# Patient Record
Sex: Female | Born: 1980 | Race: White | Hispanic: No | Marital: Married | Smoking: Never smoker
Health system: Southern US, Community
[De-identification: ages and names within clinical notes are randomized; demographics above are authoritative.]

---

## 2014-02-15 LAB — LIPID PANEL
CHOLESTEROL: 225 mg/dL — AB (ref 0–200)
HDL: 31 mg/dL — AB (ref 35–70)
LDL CALC: 159 mg/dL
TRIGLYCERIDES: 173 mg/dL — AB (ref 40–160)

## 2014-02-15 LAB — HM PAP SMEAR: HM PAP: NORMAL

## 2014-02-15 LAB — BASIC METABOLIC PANEL
BUN: 13 mg/dL (ref 4–21)
Creatinine: 0.8 mg/dL (ref 0.5–1.1)

## 2014-02-15 LAB — TSH: TSH: 0.9 u[IU]/mL (ref 0.41–5.90)

## 2014-02-15 LAB — CBC AND DIFFERENTIAL: Hemoglobin: 14.1 g/dL (ref 12.0–16.0)

## 2015-06-08 ENCOUNTER — Other Ambulatory Visit: Payer: Self-pay | Admitting: Internal Medicine

## 2015-06-08 ENCOUNTER — Encounter: Payer: Self-pay | Admitting: Internal Medicine

## 2015-06-08 DIAGNOSIS — E785 Hyperlipidemia, unspecified: Secondary | ICD-10-CM | POA: Insufficient documentation

## 2015-06-08 DIAGNOSIS — B009 Herpesviral infection, unspecified: Secondary | ICD-10-CM | POA: Insufficient documentation

## 2015-06-08 DIAGNOSIS — J45909 Unspecified asthma, uncomplicated: Secondary | ICD-10-CM | POA: Insufficient documentation

## 2015-06-08 DIAGNOSIS — J4520 Mild intermittent asthma, uncomplicated: Secondary | ICD-10-CM | POA: Insufficient documentation

## 2015-06-08 DIAGNOSIS — G5600 Carpal tunnel syndrome, unspecified upper limb: Secondary | ICD-10-CM | POA: Insufficient documentation

## 2015-10-23 ENCOUNTER — Telehealth: Payer: Self-pay

## 2015-10-23 NOTE — Telephone Encounter (Signed)
She will need to be seen for a prescription medication.  She has not been seen since 01/2014.

## 2015-10-23 NOTE — Telephone Encounter (Signed)
Patient wants diflucan called in call [phone].

## 2015-10-24 NOTE — Telephone Encounter (Signed)
[facility] x [facility]

## 2015-12-11 ENCOUNTER — Encounter: Payer: Self-pay | Admitting: Family Medicine

## 2016-01-22 DIAGNOSIS — M542 Cervicalgia: Secondary | ICD-10-CM | POA: Insufficient documentation

## 2016-02-18 ENCOUNTER — Encounter: Payer: Self-pay | Admitting: Internal Medicine

## 2016-02-18 ENCOUNTER — Ambulatory Visit (INDEPENDENT_AMBULATORY_CARE_PROVIDER_SITE_OTHER): Payer: 59 | Admitting: Internal Medicine

## 2016-02-18 VITALS — BP 118/80 | HR 84 | Resp 16 | Ht 61.0 in | Wt 190.0 lb

## 2016-02-18 DIAGNOSIS — L03012 Cellulitis of left finger: Secondary | ICD-10-CM | POA: Diagnosis not present

## 2016-02-18 DIAGNOSIS — J4520 Mild intermittent asthma, uncomplicated: Secondary | ICD-10-CM | POA: Diagnosis not present

## 2016-02-18 DIAGNOSIS — Z0000 Encounter for general adult medical examination without abnormal findings: Secondary | ICD-10-CM | POA: Diagnosis not present

## 2016-02-18 DIAGNOSIS — E785 Hyperlipidemia, unspecified: Secondary | ICD-10-CM | POA: Diagnosis not present

## 2016-02-18 DIAGNOSIS — B009 Herpesviral infection, unspecified: Secondary | ICD-10-CM

## 2016-02-18 DIAGNOSIS — Z1239 Encounter for other screening for malignant neoplasm of breast: Secondary | ICD-10-CM | POA: Diagnosis not present

## 2016-02-18 LAB — POCT URINALYSIS DIPSTICK
BILIRUBIN UA: NEGATIVE
GLUCOSE UA: NEGATIVE
Ketones, UA: NEGATIVE
Leukocytes, UA: NEGATIVE
Nitrite, UA: NEGATIVE
PH UA: 6
Protein, UA: NEGATIVE
RBC UA: NEGATIVE
SPEC GRAV UA: 1.01

## 2016-02-18 MED ORDER — ALBUTEROL SULFATE HFA 108 (90 BASE) MCG/ACT IN AERS
2.0000 | INHALATION_SPRAY | Freq: Four times a day (QID) | RESPIRATORY_TRACT | 5 refills | Status: DC | PRN
Start: 2016-02-18 — End: 2020-01-03

## 2016-02-18 MED ORDER — FLUCONAZOLE 100 MG PO TABS: 100.0000 mg | ORAL_TABLET | Freq: Every day | ORAL | 0 refills | Status: DC

## 2016-02-18 MED ORDER — VALACYCLOVIR HCL 1 G PO TABS: 1000.0000 mg | ORAL_TABLET | Freq: Every day | ORAL | 5 refills | Status: DC

## 2016-02-18 MED ORDER — AMOXICILLIN-POT CLAVULANATE 875-125 MG PO TABS: 1.0000 | ORAL_TABLET | Freq: Two times a day (BID) | ORAL | 0 refills | Status: DC

## 2016-02-18 NOTE — Progress Notes (Signed)
Date:  02/18/2016   Name:  Jennifer Perkins   DOB:  Jan 28, 1981   MRN:  [id number]   Chief Complaint: Annual Exam (Left hand 1st digit open sore hurting. ) Jennifer Perkins is a 35 y.o. female who presents today for her Complete Annual Exam. She feels well. Shereports exercising regularly. She reports she is sleeping well.   HPI Finger pain/swelling - started after working outside about one week ago.  No known injury but often gets scrapes and puncture wounds from shrubbery.  No exposure to rose thorns.  It developed small amount of purulent drainage which she expressed.  Now finger is more tender and swollen.  Review of Systems  Constitutional: Negative for chills, fatigue and fever.  HENT: Negative for congestion, hearing loss, tinnitus, trouble swallowing and voice change.   Eyes: Negative for visual disturbance.  Respiratory: Negative for cough, chest tightness, shortness of breath and wheezing.   Cardiovascular: Negative for chest pain, palpitations and leg swelling.  Gastrointestinal: Negative for abdominal pain, constipation, diarrhea and vomiting.  Endocrine: Negative for polydipsia and polyuria.  Genitourinary: Negative for dysuria, frequency, genital sores, menstrual problem, vaginal bleeding and vaginal discharge.  Musculoskeletal: Negative for arthralgias, gait problem and joint swelling.  Skin: Positive for color change and wound. Negative for rash.  Neurological: Negative for dizziness, tremors, light-headedness and headaches.  Hematological: Negative for adenopathy. Does not bruise/bleed easily.  Psychiatric/Behavioral: Negative for dysphoric mood and sleep disturbance. The patient is not nervous/anxious.     Patient Active Problem List   Diagnosis Date Noted  . 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 AB (asthmatic bronchitis) 06/08/2015  . HLD (hyperlipidemia) 06/08/2015  . Carpal tunnel syndrome 06/08/2015    Prior to Admission medications   Medication Sig  Start Date End Date Taking? Authorizing Provider  albuterol (VENTOLIN HFA) 108 (90 BASE) MCG/ACT inhaler Inhale 2 puffs into the lungs 4 (four) times daily as needed. 04/10/14  Yes Historical Provider, MD  meloxicam (MOBIC) 7.5 MG tablet Take 7.5 mg by mouth daily.   Yes Historical Provider, MD  methocarbamol (ROBAXIN) 500 MG tablet Take 500 mg by mouth 4 (four) times daily.   Yes Historical Provider, MD  valACYclovir (VALTREX) 1000 MG tablet Take 1 tablet by mouth daily. 11/09/14  Yes Historical Provider, MD  Norethindrone-Ethinyl Estradiol-Fe Biphas (LO LOESTRIN FE) 1 MG-10 MCG / 10 MCG tablet Take 1 tablet by mouth daily. 02/15/14   Historical Provider, MD    No Known Allergies  Past Surgical History:  Procedure Laterality Date  . CESAREAN SECTION  2007    Social History  Substance Use Topics  . Smoking status: Never Smoker  . Smokeless tobacco: Never Used  . Alcohol use 1.2 oz/week    2 Standard drinks or equivalent per week     Medication list has been reviewed and updated.   Physical Exam  Constitutional: She is oriented to person, place, and time. She appears well-developed and well-nourished. No distress.  HENT:  Head: Normocephalic and atraumatic.  Right Ear: Tympanic membrane and ear canal normal.  Left Ear: Tympanic membrane and ear canal normal.  Nose: Right sinus exhibits no maxillary sinus tenderness. Left sinus exhibits no maxillary sinus tenderness.  Mouth/Throat: Uvula is midline and oropharynx is clear and moist.  Eyes: Conjunctivae and EOM are normal. Right eye exhibits no discharge. Left eye exhibits no discharge. No scleral icterus.  Neck: Normal range of motion. Carotid bruit is not present. No erythema present. No thyromegaly present.  Cardiovascular: Normal rate,  regular rhythm, normal heart sounds and normal pulses.   Pulmonary/Chest: Effort normal. No respiratory distress. She has no wheezes. Right breast exhibits no mass, no nipple discharge, no skin  change and no tenderness. Left breast exhibits no mass, no nipple discharge, no skin change and no tenderness.  Abdominal: Soft. Bowel sounds are normal. There is no hepatosplenomegaly. There is no tenderness. There is no CVA tenderness.  Musculoskeletal: Normal range of motion.  Lymphadenopathy:    She has no cervical adenopathy.    She has no axillary adenopathy.  Neurological: She is alert and oriented to person, place, and time. She has normal reflexes. No cranial nerve deficit or sensory deficit.  Skin: Skin is warm, dry and intact. No rash noted.  Redness and swelling of proximal left index finger and MCP joint.  Puncture wound on distal aspect - peeling skin with no necrosis ~ 2 mm size  Psychiatric: She has a normal mood and affect. Her speech is normal and behavior is normal. Thought content normal.  Nursing note and vitals reviewed.   BP 118/80 (BP Location: Right Arm, Patient Position: Sitting, Cuff Size: Normal)   Pulse 84   Resp 16   Ht 5\' 1"  (1.549 m)   Wt 190 lb (86.2 kg)   LMP 02/01/2016   SpO2 97%   BMI 35.90 kg/m   Assessment and Plan: 1. Annual physical exam Labs and forms for employer to be done - Comprehensive metabolic panel - TSH - POCT urinalysis dipstick  2. Cellulitis of finger of left hand Need antibiotics - CBC with Differential/Platelet  3. Breast cancer screening Baseline needed - MM DIGITAL SCREENING BILATERAL; Future  4. AB (asthmatic bronchitis), mild intermittent, uncomplicated Albuterol PRN - albuterol (VENTOLIN HFA) 108 (90 Base) MCG/ACT inhaler; Inhale 2 puffs into the lungs 4 (four) times daily as needed.  Dispense: 18 g; Refill: 5  5. HLD (hyperlipidemia) - Lipid panel  6. Alphaherpesviral disease - valACYclovir (VALTREX) 1000 MG tablet; Take 1 tablet (1,000 mg total) by mouth daily.  Dispense: 30 tablet; Refill: 5   Bari EdwardLaura Shere Eisenhart, MD [facility]  02/18/2016

## 2016-02-18 NOTE — Patient Instructions (Signed)
Breast Self-Awareness Practicing breast self-awareness may pick up problems early, prevent significant medical complications, and possibly save your life. By practicing breast self-awareness, you can become familiar with how your breasts look and feel and if your breasts are changing. This allows you to notice changes early. It can also offer you some reassurance that your breast health is good. One way to learn what is normal for your breasts and whether your breasts are changing is to do a breast self-exam. If you find a lump or something that was not present in the past, it is best to contact your caregiver right away. Other findings that should be evaluated by your caregiver include nipple discharge, especially if it is bloody; skin changes or reddening; areas where the skin seems to be pulled in (retracted); or new lumps and bumps. Breast pain is seldom associated with cancer (malignancy), but should also be evaluated by a caregiver. HOW TO PERFORM A BREAST SELF-EXAM The best time to examine your breasts is 5-7 days after your menstrual period is over. During menstruation, the breasts are lumpier, and it may be more difficult to pick up changes. If you do not menstruate, have reached menopause, or had your uterus removed (hysterectomy), you should examine your breasts at regular intervals, such as monthly. If you are breastfeeding, examine your breasts after a feeding or after using a breast pump. Breast implants do not decrease the risk for lumps or tumors, so continue to perform breast self-exams as recommended. Talk to your caregiver about how to determine the difference between the implant and breast tissue. Also, talk about the amount of pressure you should use during the exam. Over time, you will become more familiar with the variations of your breasts and more comfortable with the exam. A breast self-exam requires you to remove all your clothes above the waist. 1. Look at your breasts and nipples.  Stand in front of a mirror in a room with good lighting. With your hands on your hips, push your hands firmly downward. Look for a difference in shape, contour, and size from one breast to the other (asymmetry). Asymmetry includes puckers, dips, or bumps. Also, look for skin changes, such as reddened or scaly areas on the breasts. Look for nipple changes, such as discharge, dimpling, repositioning, or redness. 2. Carefully feel your breasts. This is best done either in the shower or tub while using soapy water or when flat on your back. Place the arm (on the side of the breast you are examining) above your head. Use the pads (not the fingertips) of your three middle fingers on your opposite hand to feel your breasts. Start in the underarm area and use  inch (2 cm) overlapping circles to feel your breast. Use 3 different levels of pressure (light, medium, and firm pressure) at each circle before moving to the next circle. The light pressure is needed to feel the tissue closest to the skin. The medium pressure will help to feel breast tissue a little deeper, while the firm pressure is needed to feel the tissue close to the ribs. Continue the overlapping circles, moving downward over the breast until you feel your ribs below your breast. Then, move one finger-width towards the center of the body. Continue to use the  inch (2 cm) overlapping circles to feel your breast as you move slowly up toward the collar bone (clavicle) near the base of the neck. Continue the up and down exam using all 3 pressures until you reach the   middle of the chest. Do this with each breast, carefully feeling for lumps or changes. 3.  Keep a written record with breast changes or normal findings for each breast. By writing this information down, you do not need to depend only on memory for size, tenderness, or location. Write down where you are in your menstrual cycle, if you are still menstruating. Breast tissue can have some lumps or  thick tissue. However, see your caregiver if you find anything that concerns you.  SEEK MEDICAL CARE IF:  You see a change in shape, contour, or size of your breasts or nipples.   You see skin changes, such as reddened or scaly areas on the breasts or nipples.   You have an unusual discharge from your nipples.   You feel a new lump or unusually thick areas.    This information is not intended to replace advice given to you by your health care provider. Make sure you discuss any questions you have with your health care provider.   Document Released: 06/09/2005 Document Revised: 05/26/2012 Document Reviewed: 09/24/2011 Elsevier Interactive Patient Education 2016 Elsevier Inc.  

## 2016-02-19 LAB — COMPREHENSIVE METABOLIC PANEL
ALK PHOS: 55 IU/L (ref 39–117)
ALT: 18 IU/L (ref 0–32)
AST: 13 IU/L (ref 0–40)
Albumin/Globulin Ratio: 2 (ref 1.2–2.2)
Albumin: 4.5 g/dL (ref 3.5–5.5)
BUN/Creatinine Ratio: 18 (ref 9–23)
BUN: 12 mg/dL (ref 6–20)
Bilirubin Total: 0.4 mg/dL (ref 0.0–1.2)
CHLORIDE: 100 mmol/L (ref 96–106)
CO2: 23 mmol/L (ref 18–29)
Calcium: 9.4 mg/dL (ref 8.7–10.2)
Creatinine, Ser: 0.65 mg/dL (ref 0.57–1.00)
GFR calc Af Amer: 133 mL/min/{1.73_m2} (ref >59)
GFR calc non Af Amer: 115 mL/min/{1.73_m2} (ref >59)
GLUCOSE: 86 mg/dL (ref 65–99)
Globulin, Total: 2.3 g/dL (ref 1.5–4.5)
POTASSIUM: 4 mmol/L (ref 3.5–5.2)
SODIUM: 140 mmol/L (ref 134–144)
TOTAL PROTEIN: 6.8 g/dL (ref 6.0–8.5)

## 2016-02-19 LAB — CBC WITH DIFFERENTIAL/PLATELET
BASOS ABS: 0 10*3/uL (ref 0.0–0.2)
Basos: 0 %
EOS (ABSOLUTE): 0.3 10*3/uL (ref 0.0–0.4)
Eos: 5 %
Hematocrit: 40.5 % (ref 34.0–46.6)
Hemoglobin: 13.5 g/dL (ref 11.1–15.9)
IMMATURE GRANS (ABS): 0 10*3/uL (ref 0.0–0.1)
Immature Granulocytes: 0 %
Lymphocytes Absolute: 1.6 10*3/uL (ref 0.7–3.1)
Lymphs: 24 %
MCH: 29.7 pg (ref 26.6–33.0)
MCHC: 33.3 g/dL (ref 31.5–35.7)
MCV: 89 fL (ref 79–97)
Monocytes Absolute: 0.6 10*3/uL (ref 0.1–0.9)
Monocytes: 9 %
NEUTROS ABS: 4 10*3/uL (ref 1.4–7.0)
Neutrophils: 62 %
PLATELETS: 181 10*3/uL (ref 150–379)
RBC: 4.55 x10E6/uL (ref 3.77–5.28)
RDW: 13.1 % (ref 12.3–15.4)
WBC: 6.5 10*3/uL (ref 3.4–10.8)

## 2016-02-19 LAB — LIPID PANEL
CHOL/HDL RATIO: 5 ratio — AB (ref 0.0–4.4)
Cholesterol, Total: 203 mg/dL — ABNORMAL HIGH (ref 100–199)
HDL: 41 mg/dL (ref >39)
LDL Calculated: 142 mg/dL — ABNORMAL HIGH (ref 0–99)
TRIGLYCERIDES: 99 mg/dL (ref 0–149)
VLDL Cholesterol Cal: 20 mg/dL (ref 5–40)

## 2016-02-19 LAB — TSH: TSH: 1.45 u[IU]/mL (ref 0.450–4.500)

## 2016-03-06 ENCOUNTER — Other Ambulatory Visit: Payer: Self-pay | Admitting: Internal Medicine

## 2016-03-06 ENCOUNTER — Ambulatory Visit
Admission: RE | Admit: 2016-03-06 | Discharge: 2016-03-06 | Disposition: A | Discharge status: Home / Self Care | Payer: 59 | Source: Ambulatory Visit | Attending: Internal Medicine | Admitting: Internal Medicine

## 2016-03-06 DIAGNOSIS — Z1231 Encounter for screening mammogram for malignant neoplasm of breast: Secondary | ICD-10-CM | POA: Diagnosis not present

## 2016-03-06 DIAGNOSIS — Z1239 Encounter for other screening for malignant neoplasm of breast: Secondary | ICD-10-CM

## 2016-03-07 ENCOUNTER — Other Ambulatory Visit: Payer: Self-pay | Admitting: Internal Medicine

## 2016-03-07 DIAGNOSIS — N6320 Unspecified lump in the left breast, unspecified quadrant: Secondary | ICD-10-CM

## 2016-03-20 ENCOUNTER — Ambulatory Visit
Admission: RE | Admit: 2016-03-20 | Discharge: 2016-03-20 | Disposition: A | Discharge status: Home / Self Care | Payer: 59 | Source: Ambulatory Visit | Attending: Internal Medicine | Admitting: Internal Medicine

## 2016-03-20 DIAGNOSIS — N6320 Unspecified lump in the left breast, unspecified quadrant: Secondary | ICD-10-CM

## 2016-03-20 DIAGNOSIS — N63 Unspecified lump in breast: Secondary | ICD-10-CM | POA: Diagnosis not present

## 2016-04-01 ENCOUNTER — Encounter: Payer: Self-pay | Admitting: Internal Medicine

## 2016-05-22 ENCOUNTER — Other Ambulatory Visit: Payer: Self-pay | Admitting: Internal Medicine

## 2016-05-22 ENCOUNTER — Telehealth: Payer: Self-pay

## 2016-05-22 MED ORDER — ACYCLOVIR 400 MG PO TABS: 400.0000 mg | ORAL_TABLET | Freq: Two times a day (BID) | ORAL | 5 refills | Status: DC

## 2016-05-22 NOTE — Telephone Encounter (Signed)
Let patient know that I sent in Acyclovir 400 mg bid.

## 2016-05-22 NOTE — Telephone Encounter (Signed)
Valcyclovir to expensive but Control and instrumentation engineerAcycliver is cheaper per insurance. Patient wants us to call it in but not sure what dose would work best. She was cutting the Valcycliver in half. Wants a BID med ONLY and to use for outbreaks only.

## 2017-02-24 ENCOUNTER — Other Ambulatory Visit: Payer: Self-pay | Admitting: Internal Medicine

## 2017-02-24 ENCOUNTER — Ambulatory Visit (INDEPENDENT_AMBULATORY_CARE_PROVIDER_SITE_OTHER): Payer: 59 | Admitting: Internal Medicine

## 2017-02-24 ENCOUNTER — Encounter: Payer: Self-pay | Admitting: Internal Medicine

## 2017-02-24 VITALS — BP 128/80 | HR 83 | Ht 61.0 in | Wt 178.8 lb

## 2017-02-24 DIAGNOSIS — L659 Nonscarring hair loss, unspecified: Secondary | ICD-10-CM | POA: Diagnosis not present

## 2017-02-24 DIAGNOSIS — E782 Mixed hyperlipidemia: Secondary | ICD-10-CM | POA: Diagnosis not present

## 2017-02-24 DIAGNOSIS — B009 Herpesviral infection, unspecified: Secondary | ICD-10-CM | POA: Diagnosis not present

## 2017-02-24 DIAGNOSIS — Z0000 Encounter for general adult medical examination without abnormal findings: Secondary | ICD-10-CM | POA: Diagnosis not present

## 2017-02-24 DIAGNOSIS — J4520 Mild intermittent asthma, uncomplicated: Secondary | ICD-10-CM

## 2017-02-24 LAB — POCT URINALYSIS DIPSTICK
BILIRUBIN UA: NEGATIVE
GLUCOSE UA: NEGATIVE
Ketones, UA: NEGATIVE
Leukocytes, UA: NEGATIVE
NITRITE UA: NEGATIVE
Protein, UA: NEGATIVE
Spec Grav, UA: 1.025 (ref 1.010–1.025)
UROBILINOGEN UA: 0.2 U/dL
pH, UA: 5 (ref 5.0–8.0)

## 2017-02-24 MED ORDER — VALACYCLOVIR HCL 1 G PO TABS: 1000.0000 mg | ORAL_TABLET | Freq: Every day | ORAL | 5 refills | Status: DC

## 2017-02-24 NOTE — Patient Instructions (Signed)
Begin a Multi-vitamin with minerals and iron daily - and/or a 'skin hair and nails' supplement.  Breast Self-Awareness Breast self-awareness means being familiar with how your breasts look and feel. It involves checking your breasts regularly and reporting any changes to your health care provider. Practicing breast self-awareness is important. A change in your breasts can be a sign of a serious medical problem. Being familiar with how your breasts look and feel allows you to find any problems early, when treatment is more likely to be successful. All women should practice breast self-awareness, including women who have had breast implants. How to do a breast self-exam One way to learn what is normal for your breasts and whether your breasts are changing is to do a breast self-exam. To do a breast self-exam: Look for Changes  1. Remove all the clothing above your waist. 2. Stand in front of a mirror in a room with good lighting. 3. Put your hands on your hips. 4. Push your hands firmly downward. 5. Compare your breasts in the mirror. Look for differences between them (asymmetry), such as: ? Differences in shape. ? Differences in size. ? Puckers, dips, and bumps in one breast and not the other. 6. Look at each breast for changes in your skin, such as: ? Redness. ? Scaly areas. 7. Look for changes in your nipples, such as: ? Discharge. ? Bleeding. ? Dimpling. ? Redness. ? A change in position. Feel for Changes  Carefully feel your breasts for lumps and changes. It is best to do this while lying on your back on the floor and again while sitting or standing in the shower or tub with soapy water on your skin. Feel each breast in the following way:  Place the arm on the side of the breast you are examining above your head.  Feel your breast with the other hand.  Start in the nipple area and make  inch (2 cm) overlapping circles to feel your breast. Use the pads of your three middle  fingers to do this. Apply light pressure, then medium pressure, then firm pressure. The light pressure will allow you to feel the tissue closest to the skin. The medium pressure will allow you to feel the tissue that is a little deeper. The firm pressure will allow you to feel the tissue close to the ribs.  Continue the overlapping circles, moving downward over the breast until you feel your ribs below your breast.  Move one finger-width toward the center of the body. Continue to use the  inch (2 cm) overlapping circles to feel your breast as you move slowly up toward your collarbone.  Continue the up and down exam using all three pressures until you reach your armpit.  Write Down What You Find  Write down what is normal for each breast and any changes that you find. Keep a written record with breast changes or normal findings for each breast. By writing this information down, you do not need to depend only on memory for size, tenderness, or location. Write down where you are in your menstrual cycle, if you are still menstruating. If you are having trouble noticing differences in your breasts, do not get discouraged. With time you will become more familiar with the variations in your breasts and more comfortable with the exam. How often should I examine my breasts? Examine your breasts every month. If you are breastfeeding, the best time to examine your breasts is after a feeding or after using a  breast pump. If you menstruate, the best time to examine your breasts is 5-7 days after your period is over. During your period, your breasts are lumpier, and it may be more difficult to notice changes. When should I see my health care provider? See your health care provider if you notice:  A change in shape or size of your breasts or nipples.  A change in the skin of your breast or nipples, such as a reddened or scaly area.  Unusual discharge from your nipples.  A lump or thick area that was not  there before.  Pain in your breasts.  Anything that concerns you.  This information is not intended to replace advice given to you by your health care provider. Make sure you discuss any questions you have with your health care provider. Document Released: 06/09/2005 Document Revised: 11/15/2015 Document Reviewed: 04/29/2015 Elsevier Interactive Patient Education  [employer]2018 Elsevier Inc.

## 2017-02-24 NOTE — Progress Notes (Signed)
Date:  02/24/2017   Name:  Jennifer Perkins   DOB:  14-Jun-1981   MRN:  [id number]   Chief Complaint: Annual Exam (Breast and Pap. Noticed Acyclovir is not working as well as Valacyclovir. Wants to discuss. Wants to get thyroid checked- states hair is thinning and she feels some "crowdedness around neck." States she also has been having up and down mood swings.  ) Jennifer Perkins is a 36 y.o. female who presents today for her Complete Annual Exam. She feels well. She reports exercising 3-4 times per week. Also doing 200 sit ups daily.  She reports she is sleeping well.  She has been seeing a weight loss clinic and went on an 800 calorie diet.  Now doing intermittent fasting and low carb.  Asthma  There is no cough, shortness of breath or wheezing. This is a chronic problem. The problem occurs intermittently. Pertinent negatives include no chest pain, fever, headaches, sore throat or trouble swallowing. Her symptoms are alleviated by beta-agonist. Her past medical history is significant for asthma.  Hair thinning - the past few months has noticed more hair in the shower and her hair brush. No bowel patches. Neck fullness - she's noticed over the past several months and feels as if there is a fullness in her throat, especially when she sleeps on her stomach with her head to the side. She is concerned about possible thyroid enlargement or she feels no trouble swallowing or sore throat. Weight is stable. Mild constipation but no dry skin or rashes. 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 Having more outbreaks and less control. She would like to go back to Valtrex. Review of Systems  Constitutional: Negative for chills, fatigue and fever.  HENT: Negative for congestion, hearing loss, sore throat, tinnitus, trouble swallowing and voice change.   Eyes: Negative for visual disturbance.  Respiratory: Negative for cough, chest tightness, shortness of breath and  wheezing.   Cardiovascular: Negative for chest pain, palpitations and leg swelling.  Gastrointestinal: Negative for abdominal pain, constipation, diarrhea and vomiting.  Endocrine: Negative for polydipsia and polyuria.  Genitourinary: Negative for dysuria, frequency, genital sores, menstrual problem (menses regular, last 7 days, slightly heavier), vaginal bleeding and vaginal discharge.  Musculoskeletal: Positive for neck pain (fullness when sleeping on abdomen with head turned to the side). Negative for arthralgias, gait problem and joint swelling.  Skin: Positive for rash. Negative for color change and wound.  Allergic/Immunologic: Negative for environmental allergies.  Neurological: Negative for dizziness, tremors, light-headedness and headaches.  Hematological: Negative for adenopathy. Does not bruise/bleed easily.  Psychiatric/Behavioral: Negative for decreased concentration, dysphoric mood and sleep disturbance. The patient is not nervous/anxious.     Patient Active Problem List   Diagnosis Date Noted  . Neck pain 01/22/2016  . 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 AB (asthmatic bronchitis) 06/08/2015  . HLD (hyperlipidemia) 06/08/2015  . Carpal tunnel syndrome 06/08/2015    Prior to Admission medications   Medication Sig Start Date End Date Taking? Authorizing Provider  acyclovir (ZOVIRAX) 400 MG tablet Take 1 tablet (400 mg total) by mouth 2 (two) times daily. 05/22/16  Yes Reubin Milan, MD  albuterol (VENTOLIN HFA) 108 (90 Base) MCG/ACT inhaler Inhale 2 puffs into the lungs 4 (four) times daily as needed. 02/18/16  Yes Reubin Milan, MD    No Known Allergies  Past Surgical History:  Procedure Laterality Date  . CESAREAN SECTION  2007    Social History  Substance Use Topics  . Smoking status: Never Smoker  . Smokeless tobacco: Never Used  . Alcohol use 1.2 oz/week    2 Standard drinks or equivalent per week   Urine dipstick shows positive for RBC's.  Micro  exam: negative for WBC's or RBC's.   Medication list has been reviewed and updated.  PHQ 2/9 Scores 02/24/2017 02/18/2016  PHQ - 2 Score 1 0    Physical Exam  Constitutional: She is oriented to person, place, and time. She appears well-developed and well-nourished. No distress.  HENT:  Head: Normocephalic and atraumatic.  Right Ear: Tympanic membrane and ear canal normal.  Left Ear: Tympanic membrane and ear canal normal.  Nose: Right sinus exhibits no maxillary sinus tenderness. Left sinus exhibits no maxillary sinus tenderness.  Mouth/Throat: Uvula is midline and oropharynx is clear and moist.  Eyes: Conjunctivae and EOM are normal. Right eye exhibits no discharge. Left eye exhibits no discharge. No scleral icterus.  Neck: Normal range of motion. No muscular tenderness (but sternocleidomastoid muscles are enlarged) present. Carotid bruit is not present. No erythema present. No thyroid mass and no thyromegaly present.  Cardiovascular: Normal rate, regular rhythm, normal heart sounds and normal pulses.   Pulmonary/Chest: Effort normal. No respiratory distress. She has no wheezes. Right breast exhibits no mass, no nipple discharge, no skin change and no tenderness. Left breast exhibits no mass, no nipple discharge, no skin change and no tenderness.  Abdominal: Soft. Bowel sounds are normal. There is no hepatosplenomegaly. There is no tenderness. There is no CVA tenderness.  Genitourinary: Vagina normal and uterus normal. There is no tenderness, lesion or injury on the right labia. There is no tenderness, lesion or injury on the left labia. Cervix exhibits no motion tenderness. Right adnexum displays no mass, no tenderness and no fullness. Left adnexum displays no mass, no tenderness and no fullness.  Musculoskeletal: Normal range of motion.  Lymphadenopathy:    She has no cervical adenopathy.    She has no axillary adenopathy.  Neurological: She is alert and oriented to person, place, and  time. She has normal reflexes. No cranial nerve deficit or sensory deficit.  Skin: Skin is warm, dry and intact. No rash noted.  Scalp normal   Psychiatric: She has a normal mood and affect. Her speech is normal and behavior is normal. Thought content normal.  Nursing note and vitals reviewed.   BP 128/80   Pulse 83   Ht 5\' 1"  (1.549 m)   Wt 178 lb 12.8 oz (81.1 kg)   LMP 02/01/2017 (Exact Date)   SpO2 98%   BMI 33.78 kg/m   Assessment and Plan: 1. Annual physical exam    Continue exercise and dietary changes Consider multi-vit with minerals or Skin/hair/nails supplement                             - Comprehensive metabolic panel - POCT urinalysis dipstick - Pap IG and HPV (high risk) DNA detection - Hemoglobin A1c  2. Mixed hyperlipidemia - Lipid panel  3. Alphaherpesviral disease Change back to valtrex for better efficacy - valACYclovir (VALTREX) 1000 MG tablet; Take 1 tablet (1,000 mg total) by mouth daily.  Dispense: 30 tablet; Refill: 5 - CBC with Differential/Platelet  4. Hair thinning - TSH  5. Mild intermittent asthma in adult without complication Stable; using albuterol only several times per year   Meds ordered this encounter  Medications  . valACYclovir (VALTREX) 1000 MG  tablet    Sig: Take 1 tablet (1,000 mg total) by mouth daily.    Dispense:  30 tablet    Refill:  5    Partially dictated using Animal nutritionist. Any errors are unintentional.  Bari Edward, MD [facility]  02/24/2017

## 2017-02-25 LAB — LIPID PANEL
CHOL/HDL RATIO: 5.9 ratio — AB (ref 0.0–4.4)
Cholesterol, Total: 219 mg/dL — ABNORMAL HIGH (ref 100–199)
HDL: 37 mg/dL — ABNORMAL LOW (ref >39)
LDL CALC: 160 mg/dL — AB (ref 0–99)
Triglycerides: 108 mg/dL (ref 0–149)
VLDL Cholesterol Cal: 22 mg/dL (ref 5–40)

## 2017-02-25 LAB — COMPREHENSIVE METABOLIC PANEL
A/G RATIO: 1.7 (ref 1.2–2.2)
ALBUMIN: 4.5 g/dL (ref 3.5–5.5)
ALT: 16 IU/L (ref 0–32)
AST: 16 IU/L (ref 0–40)
Alkaline Phosphatase: 58 IU/L (ref 39–117)
BUN / CREAT RATIO: 18 (ref 9–23)
BUN: 14 mg/dL (ref 6–20)
Bilirubin Total: 0.8 mg/dL (ref 0.0–1.2)
CO2: 23 mmol/L (ref 20–29)
CREATININE: 0.79 mg/dL (ref 0.57–1.00)
Calcium: 9.4 mg/dL (ref 8.7–10.2)
Chloride: 101 mmol/L (ref 96–106)
GFR, EST AFRICAN AMERICAN: 111 mL/min/{1.73_m2} (ref >59)
GFR, EST NON AFRICAN AMERICAN: 97 mL/min/{1.73_m2} (ref >59)
GLOBULIN, TOTAL: 2.6 g/dL (ref 1.5–4.5)
Glucose: 95 mg/dL (ref 65–99)
POTASSIUM: 4 mmol/L (ref 3.5–5.2)
SODIUM: 139 mmol/L (ref 134–144)
TOTAL PROTEIN: 7.1 g/dL (ref 6.0–8.5)

## 2017-02-25 LAB — CBC WITH DIFFERENTIAL/PLATELET
BASOS: 0 %
Basophils Absolute: 0 10*3/uL (ref 0.0–0.2)
EOS (ABSOLUTE): 0.2 10*3/uL (ref 0.0–0.4)
EOS: 4 %
HEMATOCRIT: 41.7 % (ref 34.0–46.6)
HEMOGLOBIN: 13.5 g/dL (ref 11.1–15.9)
IMMATURE GRANS (ABS): 0 10*3/uL (ref 0.0–0.1)
IMMATURE GRANULOCYTES: 0 %
Lymphocytes Absolute: 1.2 10*3/uL (ref 0.7–3.1)
Lymphs: 21 %
MCH: 28.8 pg (ref 26.6–33.0)
MCHC: 32.4 g/dL (ref 31.5–35.7)
MCV: 89 fL (ref 79–97)
MONOCYTES: 9 %
MONOS ABS: 0.5 10*3/uL (ref 0.1–0.9)
NEUTROS PCT: 66 %
Neutrophils Absolute: 3.9 10*3/uL (ref 1.4–7.0)
Platelets: 200 10*3/uL (ref 150–379)
RBC: 4.69 x10E6/uL (ref 3.77–5.28)
RDW: 13.8 % (ref 12.3–15.4)
WBC: 5.9 10*3/uL (ref 3.4–10.8)

## 2017-02-25 LAB — HEMOGLOBIN A1C
ESTIMATED AVERAGE GLUCOSE: 100 mg/dL
Hgb A1c MFr Bld: 5.1 % (ref 4.8–5.6)

## 2017-02-25 LAB — TSH: TSH: 1.94 u[IU]/mL (ref 0.450–4.500)

## 2017-02-26 LAB — PAP IG AND HPV HIGH-RISK
HPV, high-risk: NEGATIVE
PAP Smear Comment: 0

## 2017-02-26 LAB — PLEASE NOTE

## 2018-02-27 ENCOUNTER — Other Ambulatory Visit: Payer: Self-pay | Admitting: Internal Medicine

## 2018-02-27 DIAGNOSIS — B009 Herpesviral infection, unspecified: Secondary | ICD-10-CM

## 2019-02-18 ENCOUNTER — Ambulatory Visit (INDEPENDENT_AMBULATORY_CARE_PROVIDER_SITE_OTHER): Payer: 59 | Admitting: Internal Medicine

## 2019-02-18 ENCOUNTER — Other Ambulatory Visit: Payer: Self-pay

## 2019-02-18 ENCOUNTER — Encounter: Payer: Self-pay | Admitting: Internal Medicine

## 2019-02-18 VITALS — BP 108/77 | HR 95 | Resp 16 | Ht 61.0 in | Wt 178.6 lb

## 2019-02-18 DIAGNOSIS — L03211 Cellulitis of face: Secondary | ICD-10-CM | POA: Diagnosis not present

## 2019-02-18 DIAGNOSIS — B009 Herpesviral infection, unspecified: Secondary | ICD-10-CM | POA: Diagnosis not present

## 2019-02-18 MED ORDER — SULFAMETHOXAZOLE-TRIMETHOPRIM 800-160 MG PO TABS: 1.0000 | ORAL_TABLET | Freq: Two times a day (BID) | ORAL | 0 refills | Status: AC

## 2019-02-18 MED ORDER — VALACYCLOVIR HCL 1 G PO TABS: 1000.0000 mg | ORAL_TABLET | Freq: Every day | ORAL | 5 refills | Status: DC

## 2019-02-18 NOTE — Progress Notes (Signed)
Date:  02/18/2019   Name:  Jennifer Perkins   DOB:  1980-12-01   MRN:  [id number]   Chief Complaint: Eye Problem (cold sore under eye since Sunday )  Rash This is a recurrent problem. The problem has been gradually improving since onset. The affected locations include the face. The rash is characterized by blistering, redness and swelling. Pertinent negatives include no cough, eye pain, fatigue, fever or shortness of breath. (Recurrent HSV1)  She noted the onset of the lesion 5 days ago.  She started taking Valtrex twice a day.  Yesterday after a nap the lesion was more swollen and she became concerned.  Today it is less swollen but she decided to come anyway.  Review of Systems  Constitutional: Negative for chills, fatigue, fever and unexpected weight change.  HENT: Positive for facial swelling. Negative for ear pain and trouble swallowing.   Eyes: Negative for pain, redness and visual disturbance.  Respiratory: Negative for cough, chest tightness and shortness of breath.   Cardiovascular: Negative for chest pain.  Skin: Positive for rash.  Neurological: Negative for dizziness, light-headedness and headaches.  Psychiatric/Behavioral: Negative for dysphoric mood. The patient is not nervous/anxious.     Patient Active Problem List   Diagnosis Date Noted  . Neck pain 01/22/2016  . 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 Asthma in adult 06/08/2015  . HLD (hyperlipidemia) 06/08/2015  . Carpal tunnel syndrome 06/08/2015    No Known Allergies  Past Surgical History:  Procedure Laterality Date  . CESAREAN SECTION  2007    Social History   Tobacco Use  . Smoking status: Never Smoker  . Smokeless tobacco: Never Used  Substance Use Topics  . Alcohol use: Yes    Alcohol/week: 2.0 standard drinks    Types: 2 Standard drinks or equivalent per week  . Drug use: Not on file     Medication list has been reviewed and updated.  Current Meds  Medication Sig  . albuterol  (VENTOLIN HFA) 108 (90 Base) MCG/ACT inhaler Inhale 2 puffs into the lungs 4 (four) times daily as needed.  . valACYclovir (VALTREX) 1000 MG tablet TAKE 1 TABLET BY MOUTH EVERY DAY    PHQ 2/9 Scores 02/18/2019 02/24/2017 02/18/2016  PHQ - 2 Score 0 1 0    BP Readings from Last 3 Encounters:  02/18/19 108/77  02/24/17 128/80  02/18/16 118/80    Physical Exam Vitals signs and nursing note reviewed.  Constitutional:      General: She is not in acute distress.    Appearance: She is well-developed.  HENT:     Head: Normocephalic and atraumatic.  Eyes:     Extraocular Movements: Extraocular movements intact.     Conjunctiva/sclera: Conjunctivae normal.     Pupils: Pupils are equal, round, and reactive to light.  Cardiovascular:     Rate and Rhythm: Normal rate and regular rhythm.     Pulses: Normal pulses.     Heart sounds: No murmur.  Pulmonary:     Effort: Pulmonary effort is normal. No respiratory distress.     Breath sounds: No wheezing or rhonchi.  Musculoskeletal: Normal range of motion.  Lymphadenopathy:     Cervical: No cervical adenopathy.  Skin:    General: Skin is warm and dry.     Findings: No rash.       Neurological:     Mental Status: She is alert and oriented to person, place, and time.  Psychiatric:  Behavior: Behavior normal.        Thought Content: Thought content normal.     Wt Readings from Last 3 Encounters:  02/18/19 178 lb 9.6 oz (81 kg)  02/24/17 178 lb 12.8 oz (81.1 kg)  02/18/16 190 lb (86.2 kg)    BP 108/77   Pulse 95   Resp 16   Ht 5\' 1"  (1.549 m)   Wt 178 lb 9.6 oz (81 kg)   LMP 02/18/2019   SpO2 98%   BMI 33.75 kg/m   Assessment and Plan: 1. Alphaherpesviral disease Continue valtrex 1000 mg bid x 7 days - valACYclovir (VALTREX) 1000 MG tablet; Take 1 tablet (1,000 mg total) by mouth daily.  Dispense: 30 tablet; Refill: 5  2. Facial cellulitis May be early super-infection and given the location, would treat with an agent  to cover MRSA Follow up if no improvement - sulfamethoxazole-trimethoprim (BACTRIM DS) 800-160 MG tablet; Take 1 tablet by mouth 2 (two) times daily for 10 days.  Dispense: 20 tablet; Refill: 0   Partially dictated using Animal nutritionistDragon software. Any errors are unintentional.  Bari EdwardLaura Nimai Burbach, MD [facility]  02/18/2019

## 2019-02-22 ENCOUNTER — Encounter: Payer: 59 | Admitting: Internal Medicine

## 2019-06-15 ENCOUNTER — Encounter: Payer: 59 | Admitting: Internal Medicine

## 2019-12-05 ENCOUNTER — Encounter: Payer: 59 | Admitting: Internal Medicine

## 2020-01-03 ENCOUNTER — Encounter: Payer: Self-pay | Admitting: Internal Medicine

## 2020-01-03 ENCOUNTER — Ambulatory Visit (INDEPENDENT_AMBULATORY_CARE_PROVIDER_SITE_OTHER): Payer: 59 | Admitting: Internal Medicine

## 2020-01-03 ENCOUNTER — Other Ambulatory Visit: Payer: Self-pay

## 2020-01-03 VITALS — BP 118/60 | HR 83 | Temp 98.0°F | Ht 61.0 in | Wt 180.0 lb

## 2020-01-03 DIAGNOSIS — Z1159 Encounter for screening for other viral diseases: Secondary | ICD-10-CM | POA: Diagnosis not present

## 2020-01-03 DIAGNOSIS — J4520 Mild intermittent asthma, uncomplicated: Secondary | ICD-10-CM | POA: Diagnosis not present

## 2020-01-03 DIAGNOSIS — N6002 Solitary cyst of left breast: Secondary | ICD-10-CM | POA: Diagnosis not present

## 2020-01-03 DIAGNOSIS — Z0000 Encounter for general adult medical examination without abnormal findings: Secondary | ICD-10-CM

## 2020-01-03 DIAGNOSIS — B009 Herpesviral infection, unspecified: Secondary | ICD-10-CM

## 2020-01-03 LAB — POCT URINALYSIS DIPSTICK
Bilirubin, UA: NEGATIVE
Glucose, UA: NEGATIVE
Ketones, UA: NEGATIVE
Leukocytes, UA: NEGATIVE
Nitrite, UA: NEGATIVE
Protein, UA: NEGATIVE
Spec Grav, UA: 1.01 (ref 1.010–1.025)
Urobilinogen, UA: 0.2 E.U./dL
pH, UA: 5 (ref 5.0–8.0)

## 2020-01-03 MED ORDER — ALBUTEROL SULFATE HFA 108 (90 BASE) MCG/ACT IN AERS: 2.0000 | INHALATION_SPRAY | Freq: Four times a day (QID) | RESPIRATORY_TRACT | 5 refills | Status: AC | PRN

## 2020-01-03 MED ORDER — VALACYCLOVIR HCL 1 G PO TABS: 1000.0000 mg | ORAL_TABLET | Freq: Every day | ORAL | 5 refills | Status: AC

## 2020-01-03 NOTE — Progress Notes (Signed)
Date:  01/03/2020   Name:  Jennifer Perkins   DOB:  Feb 27, 1981   MRN:  [id number]   Chief Complaint: Annual Exam (breast exam no pap/ vit d level check )  Jennifer Perkins is a 39 y.o. female who presents today for her Complete Annual Exam. She feels well. She reports exercising rides bike 3 times a week/walks everyday. She reports she is sleeping fairly well. Breast complaints none. She has no menstrual issues.  Mammogram: 2017 left breast cyst - did not get short term follow up Pap smear: 02/2017 negative with cotesting Colonoscopy:none  Immunization History  Administered Date(s) Administered  . Tdap 06/24/2010    Asthma There is no cough, shortness of breath or wheezing. This is a recurrent problem. The problem occurs rarely. Pertinent negatives include no chest pain, fever, headaches or trouble swallowing. Her symptoms are alleviated by beta-agonist (uses albuterol rarely). Her past medical history is significant for asthma.    Lab Results  Component Value Date   CREATININE 0.79 02/24/2017   BUN 14 02/24/2017   NA 139 02/24/2017   K 4.0 02/24/2017   CL 101 02/24/2017   CO2 23 02/24/2017   Lab Results  Component Value Date   CHOL 219 (H) 02/24/2017   HDL 37 (L) 02/24/2017   LDLCALC 160 (H) 02/24/2017   TRIG 108 02/24/2017   CHOLHDL 5.9 (H) 02/24/2017   Lab Results  Component Value Date   TSH 1.940 02/24/2017   Lab Results  Component Value Date   HGBA1C 5.1 02/24/2017   Lab Results  Component Value Date   WBC 5.9 02/24/2017   HGB 13.5 02/24/2017   HCT 41.7 02/24/2017   MCV 89 02/24/2017   PLT 200 02/24/2017   Lab Results  Component Value Date   ALT 16 02/24/2017   AST 16 02/24/2017   ALKPHOS 58 02/24/2017   BILITOT 0.8 02/24/2017     Review of Systems  Constitutional: Negative for chills, fatigue and fever.  HENT: Negative for congestion, hearing loss, tinnitus, trouble swallowing and voice change.   Eyes: Negative for visual disturbance.    Respiratory: Negative for cough, chest tightness, shortness of breath and wheezing.   Cardiovascular: Negative for chest pain, palpitations and leg swelling.  Gastrointestinal: Positive for constipation (occasionally ). Negative for abdominal pain, diarrhea and vomiting.  Endocrine: Negative for polydipsia and polyuria.  Genitourinary: Negative for dysuria, frequency, genital sores, vaginal bleeding and vaginal discharge.  Musculoskeletal: Positive for arthralgias (plantar fasciitis). Negative for gait problem and joint swelling.  Skin: Negative for color change and rash.  Neurological: Negative for dizziness, tremors, light-headedness and headaches.  Hematological: Negative for adenopathy. Does not bruise/bleed easily.  Psychiatric/Behavioral: Negative for dysphoric mood and sleep disturbance. The patient is not nervous/anxious.     Patient Active Problem List   Diagnosis Date Noted  . Neck pain 01/22/2016  . 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 Mild intermittent asthma without complication 06/08/2015  . HLD (hyperlipidemia) 06/08/2015  . Carpal tunnel syndrome 06/08/2015    No Known Allergies  Past Surgical History:  Procedure Laterality Date  . CESAREAN SECTION  2007    Social History   Tobacco Use  . Smoking status: Never Smoker  . Smokeless tobacco: Never Used  Substance Use Topics  . Alcohol use: Yes    Alcohol/week: 2.0 standard drinks    Types: 2 Standard drinks or equivalent per week  . Drug use: Not on file     Medication list has been reviewed and  updated.  Current Meds  Medication Sig  . albuterol (VENTOLIN HFA) 108 (90 Base) MCG/ACT inhaler Inhale 2 puffs into the lungs 4 (four) times daily as needed.  . valACYclovir (VALTREX) 1000 MG tablet Take 1 tablet (1,000 mg total) by mouth daily. (Patient taking differently: Take 1,000 mg by mouth as needed. )  . VITAMIN D PO Take 1 tablet by mouth as needed.    PHQ 2/9 Scores 01/03/2020 02/18/2019 02/24/2017  02/18/2016  PHQ - 2 Score 0 0 1 0  PHQ- 9 Score 0 - - -    GAD 7 : Generalized Anxiety Score 01/03/2020  Nervous, Anxious, on Edge 0  Control/stop worrying 0  Worry too much - different things 0  Trouble relaxing 0  Restless 0  Easily annoyed or irritable 0  Afraid - awful might happen 0  Total GAD 7 Score 0  Anxiety Difficulty Not difficult at all    BP Readings from Last 3 Encounters:  01/03/20 118/60  02/18/19 108/77  02/24/17 128/80    Physical Exam Vitals and nursing note reviewed.  Constitutional:      General: She is not in acute distress.    Appearance: She is well-developed.  HENT:     Head: Normocephalic and atraumatic.     Right Ear: Tympanic membrane and ear canal normal.     Left Ear: Tympanic membrane and ear canal normal.     Nose:     Right Sinus: No maxillary sinus tenderness.     Left Sinus: No maxillary sinus tenderness.  Eyes:     General: No scleral icterus.       Right eye: No discharge.        Left eye: No discharge.     Conjunctiva/sclera: Conjunctivae normal.  Neck:     Thyroid: No thyromegaly.     Vascular: No carotid bruit.  Cardiovascular:     Rate and Rhythm: Normal rate and regular rhythm.     Pulses: Normal pulses.     Heart sounds: Normal heart sounds.  Pulmonary:     Effort: Pulmonary effort is normal. No respiratory distress.     Breath sounds: No wheezing.  Chest:     Breasts:        Right: No mass, nipple discharge, skin change or tenderness.        Left: No mass, nipple discharge, skin change or tenderness.  Abdominal:     General: Bowel sounds are normal.     Palpations: Abdomen is soft.     Tenderness: There is no abdominal tenderness.  Musculoskeletal:     Cervical back: Normal range of motion. No erythema.     Right lower leg: No edema.     Left lower leg: No edema.  Lymphadenopathy:     Cervical: No cervical adenopathy.  Skin:    General: Skin is warm and dry.     Capillary Refill: Capillary refill takes less  than 2 seconds.     Findings: No rash.  Neurological:     General: No focal deficit present.     Mental Status: She is alert and oriented to person, place, and time.     Cranial Nerves: No cranial nerve deficit.     Sensory: No sensory deficit.     Deep Tendon Reflexes: Reflexes are normal and symmetric.  Psychiatric:        Attention and Perception: Attention normal.        Mood and Affect: Mood normal.  Wt Readings from Last 3 Encounters:  01/03/20 180 lb (81.6 kg)  02/18/19 178 lb 9.6 oz (81 kg)  02/24/17 178 lb 12.8 oz (81.1 kg)    BP 118/60   Pulse 83   Temp 98 F (36.7 C) (Oral)   Ht 5\' 1"  (1.549 m)   Wt 180 lb (81.6 kg)   LMP 12/27/2019   SpO2 99%   BMI 34.01 kg/m   Assessment and Plan: 1. Annual physical exam Normal exam Continue healthy diet and exercise - CBC with Differential/Platelet - Comprehensive metabolic panel - Lipid panel - TSH - POCT urinalysis dipstick - VITAMIN D 25 Hydroxy (Vit-D Deficiency, Fractures) - Hemoglobin A1c  2. Need for hepatitis C screening test - Hepatitis C antibody  3. Mild intermittent asthma without complication Stable mild symptoms exacerbated by outdoor allergens Does not require daily maintenance therapy - albuterol (VENTOLIN HFA) 108 (90 Base) MCG/ACT inhaler; Inhale 2 puffs into the lungs 4 (four) times daily as needed.  Dispense: 18 g; Refill: 5  4. Benign cyst of left breast Did not get follow on left breast cyst from 2017 Normal exam today - MM DIAG BREAST TOMO BILATERAL; Future - 2018 BREAST LTD UNI RIGHT INC AXILLA; Future - US BREAST LTD UNI LEFT INC AXILLA; Future  5. Alphaherpesviral disease - valACYclovir (VALTREX) 1000 MG tablet; Take 1 tablet (1,000 mg total) by mouth daily.  Dispense: 30 tablet; Refill: 5   Partially dictated using [place]. Any errors are unintentional.  Animal nutritionist, MD [facility]  01/03/2020

## 2020-01-04 LAB — COMPREHENSIVE METABOLIC PANEL
ALT: 18 IU/L (ref 0–32)
AST: 15 IU/L (ref 0–40)
Albumin/Globulin Ratio: 2 (ref 1.2–2.2)
Albumin: 4.7 g/dL (ref 3.8–4.8)
Alkaline Phosphatase: 58 IU/L (ref 48–121)
BUN/Creatinine Ratio: 15 (ref 9–23)
BUN: 11 mg/dL (ref 6–20)
Bilirubin Total: 0.4 mg/dL (ref 0.0–1.2)
CO2: 24 mmol/L (ref 20–29)
Calcium: 9.5 mg/dL (ref 8.7–10.2)
Chloride: 102 mmol/L (ref 96–106)
Creatinine, Ser: 0.74 mg/dL (ref 0.57–1.00)
GFR calc Af Amer: 118 mL/min/{1.73_m2} (ref >59)
GFR calc non Af Amer: 102 mL/min/{1.73_m2} (ref >59)
Globulin, Total: 2.4 g/dL (ref 1.5–4.5)
Glucose: 87 mg/dL (ref 65–99)
Potassium: 4.5 mmol/L (ref 3.5–5.2)
Sodium: 139 mmol/L (ref 134–144)
Total Protein: 7.1 g/dL (ref 6.0–8.5)

## 2020-01-04 LAB — CBC WITH DIFFERENTIAL/PLATELET
Basophils Absolute: 0 10*3/uL (ref 0.0–0.2)
Basos: 0 %
EOS (ABSOLUTE): 0.4 10*3/uL (ref 0.0–0.4)
Eos: 8 %
Hematocrit: 43.3 % (ref 34.0–46.6)
Hemoglobin: 14.2 g/dL (ref 11.1–15.9)
Immature Grans (Abs): 0 10*3/uL (ref 0.0–0.1)
Immature Granulocytes: 0 %
Lymphocytes Absolute: 1.4 10*3/uL (ref 0.7–3.1)
Lymphs: 28 %
MCH: 28.8 pg (ref 26.6–33.0)
MCHC: 32.8 g/dL (ref 31.5–35.7)
MCV: 88 fL (ref 79–97)
Monocytes Absolute: 0.4 10*3/uL (ref 0.1–0.9)
Monocytes: 8 %
Neutrophils Absolute: 2.9 10*3/uL (ref 1.4–7.0)
Neutrophils: 56 %
Platelets: 237 10*3/uL (ref 150–450)
RBC: 4.93 x10E6/uL (ref 3.77–5.28)
RDW: 12.5 % (ref 11.7–15.4)
WBC: 5.1 10*3/uL (ref 3.4–10.8)

## 2020-01-04 LAB — TSH: TSH: 0.956 u[IU]/mL (ref 0.450–4.500)

## 2020-01-04 LAB — LIPID PANEL
Chol/HDL Ratio: 6.1 ratio — ABNORMAL HIGH (ref 0.0–4.4)
Cholesterol, Total: 237 mg/dL — ABNORMAL HIGH (ref 100–199)
HDL: 39 mg/dL — ABNORMAL LOW (ref >39)
LDL Chol Calc (NIH): 179 mg/dL — ABNORMAL HIGH (ref 0–99)
Triglycerides: 107 mg/dL (ref 0–149)
VLDL Cholesterol Cal: 19 mg/dL (ref 5–40)

## 2020-01-04 LAB — HEPATITIS C ANTIBODY: Hep C Virus Ab: <0.1 s/co ratio (ref 0.0–0.9)

## 2020-01-04 LAB — HEMOGLOBIN A1C
Est. average glucose Bld gHb Est-mCnc: 100 mg/dL
Hgb A1c MFr Bld: 5.1 % (ref 4.8–5.6)

## 2020-01-04 LAB — VITAMIN D 25 HYDROXY (VIT D DEFICIENCY, FRACTURES): Vit D, 25-Hydroxy: 35.2 ng/mL (ref 30.0–100.0)

## 2020-01-17 ENCOUNTER — Ambulatory Visit
Admission: RE | Admit: 2020-01-17 | Discharge: 2020-01-17 | Disposition: A | Discharge status: Home / Self Care | Payer: 59 | Source: Ambulatory Visit | Attending: Internal Medicine | Admitting: Internal Medicine

## 2020-01-17 DIAGNOSIS — N6002 Solitary cyst of left breast: Secondary | ICD-10-CM

## 2020-09-27 IMAGING — MG DIGITAL DIAGNOSTIC BILAT W/ TOMO W/ CAD
6 of 10 series · 6 of 30 positions shown · non-contrast
Comparison: Previous exam(s).

CLINICAL DATA: 39-year-old female presenting for delayed follow-up
of a probably benign left breast mass.

EXAM:
DIGITAL DIAGNOSTIC BILATERAL MAMMOGRAM WITH TOMO AND CAD

[L CC synth-2D]
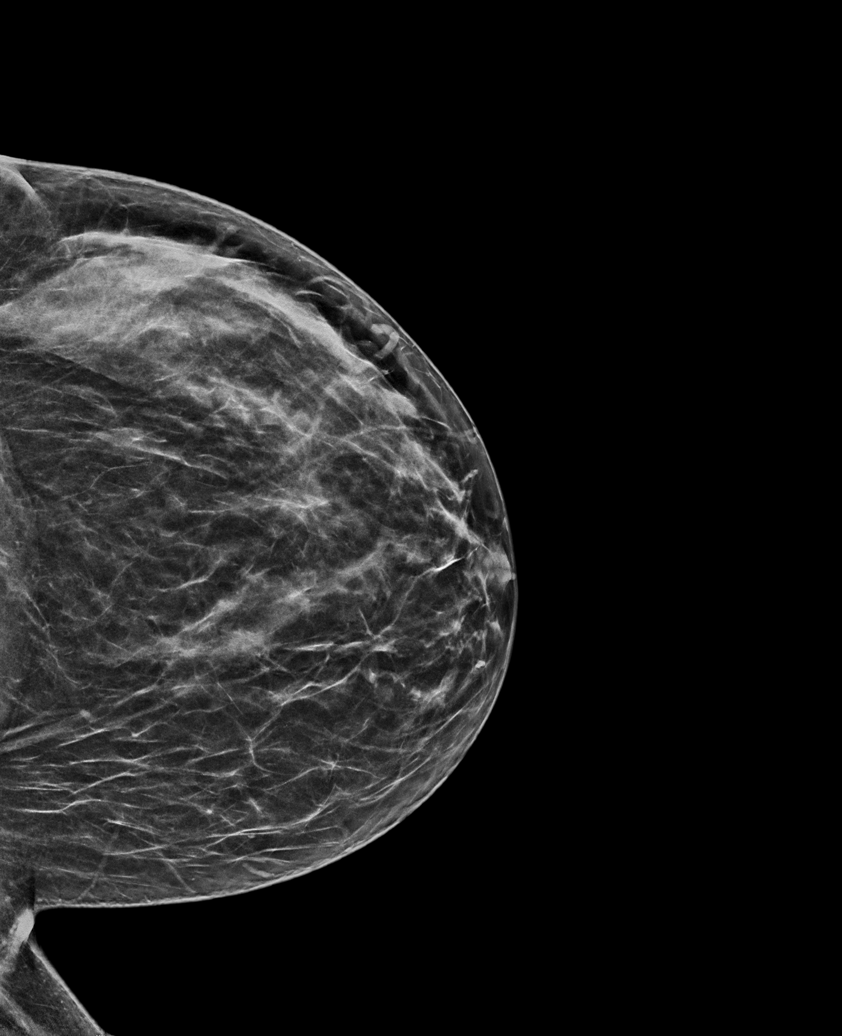

[L MLO synth-2D]
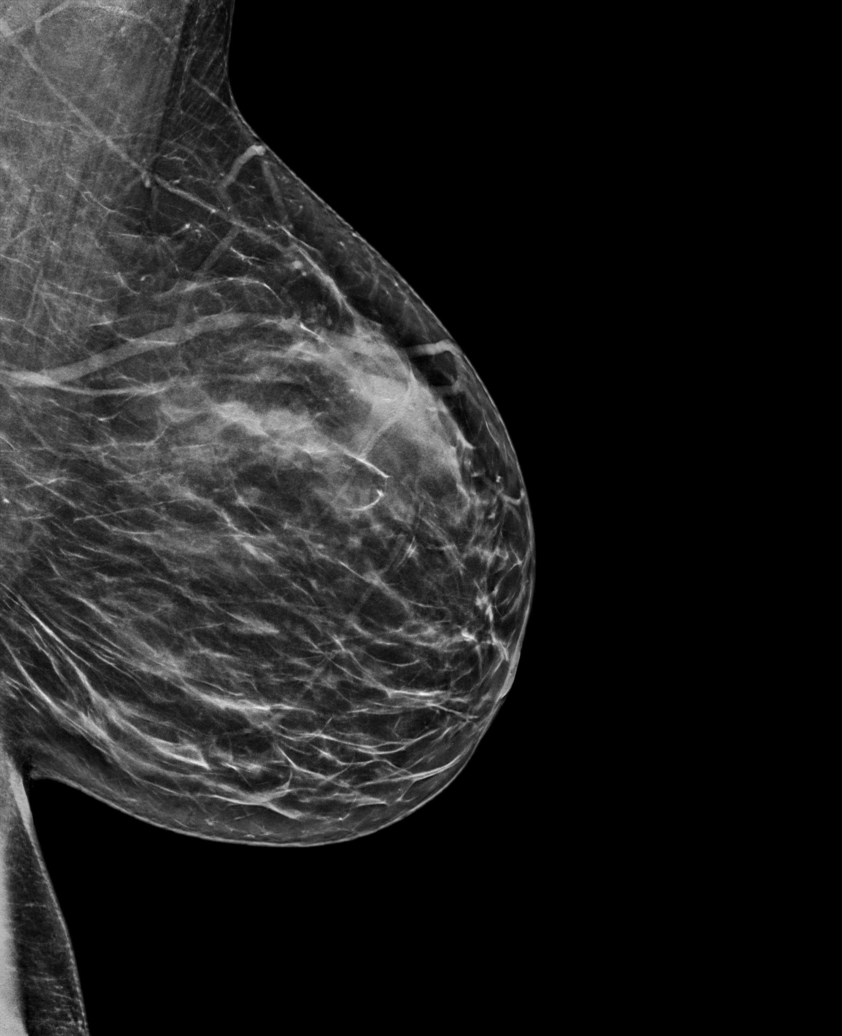

[R MLO synth-2D (1 of 2)]
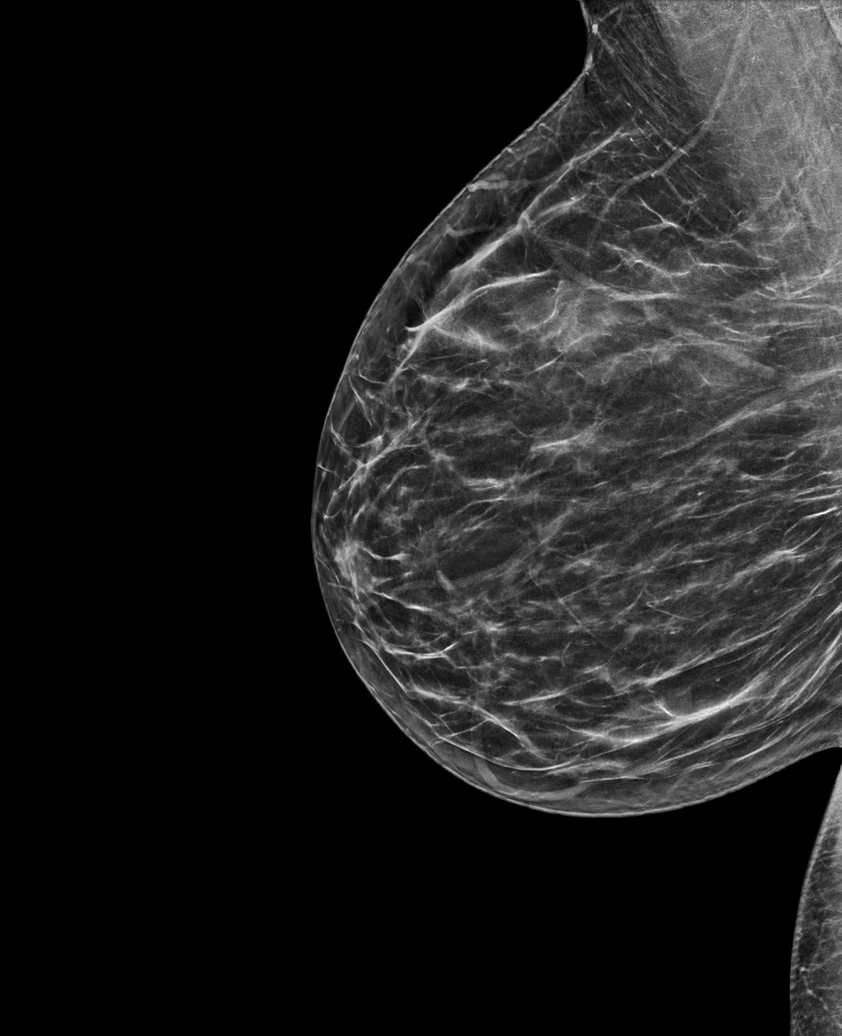

[R MLO synth-2D (2 of 2)]
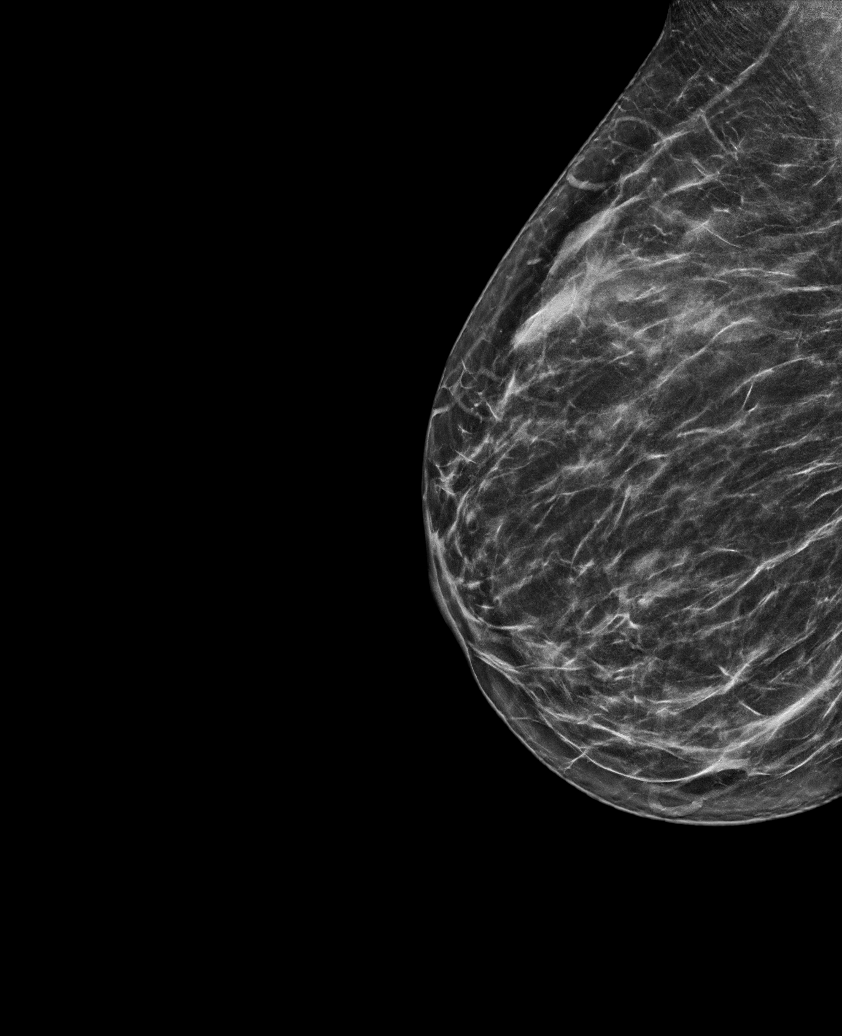

[R CC synth-2D]
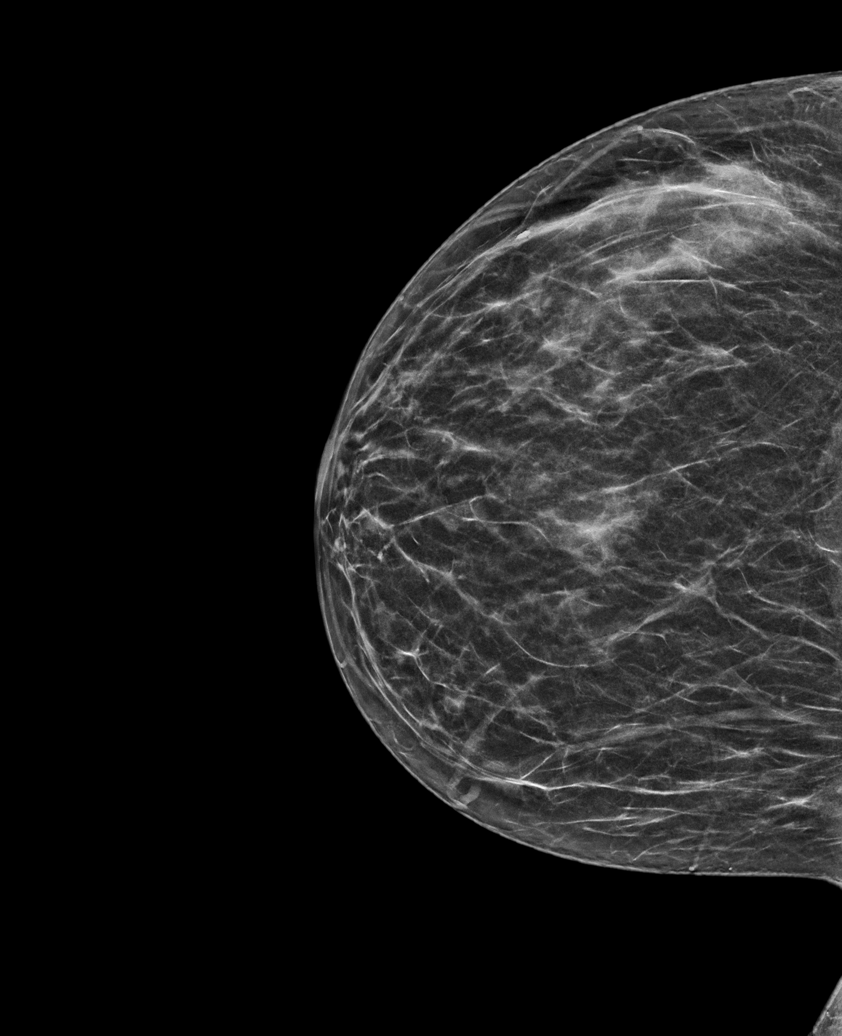

[R CC tomo · tomo slice 27/54.0]
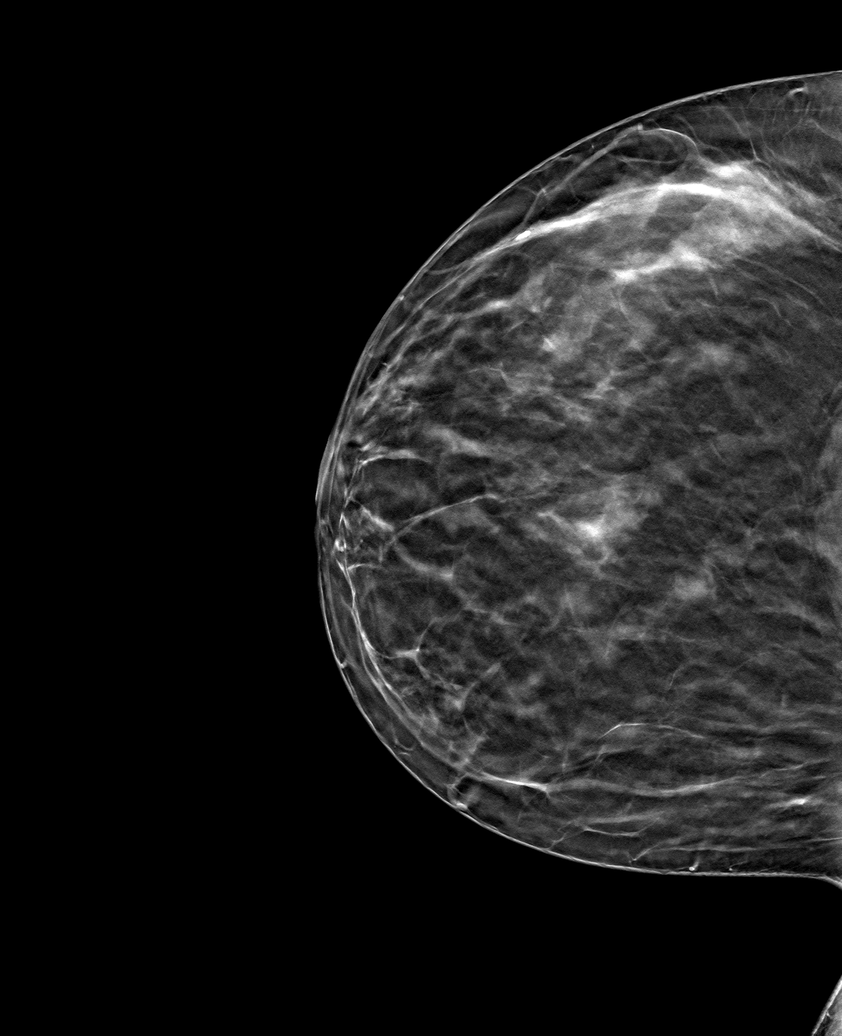

[6 of 30 positions shown; findings below may reference images not displayed]

ACR Breast Density Category c: The breast tissue is heterogeneously
dense, which may obscure small masses.
FINDINGS: Previously described, circumscribed mass in the lower inner quadrant
of the left breast at far posterior depth has completely resolved in
the interim. No new or suspicious findings are identified in either
breast.

Mammographic images were processed with CAD.
IMPRESSION: No mammographic evidence of malignancy.

RECOMMENDATION:
Screening mammogram in one year.(Code:UB-F-OYF)

I have discussed the findings and recommendations with the patient.
If applicable, a reminder letter will be sent to the patient
regarding the next appointment.

BI-RADS CATEGORY  2: Benign.
